# Patient Record
Sex: Male | Born: 1975 | Race: White | Hispanic: No | Marital: Single | Smoking: Current every day smoker
Health system: Southern US, Community
[De-identification: ages and names within clinical notes are randomized; demographics above are authoritative.]

---

## 2017-01-22 ENCOUNTER — Encounter (HOSPITAL_BASED_OUTPATIENT_CLINIC_OR_DEPARTMENT_OTHER): Payer: Self-pay | Admitting: Emergency Medicine

## 2017-01-22 ENCOUNTER — Emergency Department (HOSPITAL_BASED_OUTPATIENT_CLINIC_OR_DEPARTMENT_OTHER): Payer: Managed Care, Other (non HMO)

## 2017-01-22 ENCOUNTER — Other Ambulatory Visit: Payer: Self-pay

## 2017-01-22 DIAGNOSIS — R0981 Nasal congestion: Secondary | ICD-10-CM | POA: Diagnosis not present

## 2017-01-22 DIAGNOSIS — F17210 Nicotine dependence, cigarettes, uncomplicated: Secondary | ICD-10-CM | POA: Diagnosis not present

## 2017-01-22 DIAGNOSIS — M545 Low back pain: Secondary | ICD-10-CM | POA: Insufficient documentation

## 2017-01-22 DIAGNOSIS — J40 Bronchitis, not specified as acute or chronic: Secondary | ICD-10-CM | POA: Diagnosis not present

## 2017-01-22 DIAGNOSIS — R05 Cough: Secondary | ICD-10-CM | POA: Diagnosis present

## 2017-01-22 DIAGNOSIS — R000 Tachycardia, unspecified: Secondary | ICD-10-CM | POA: Diagnosis not present

## 2017-01-22 NOTE — ED Triage Notes (Signed)
PT presents with c/o back pain and cough. PT reports cough for 3 days and back pain that started last night.

## 2017-01-23 ENCOUNTER — Emergency Department (HOSPITAL_BASED_OUTPATIENT_CLINIC_OR_DEPARTMENT_OTHER)
Admission: EM | Admit: 2017-01-23 | Discharge: 2017-01-23 | Disposition: A | Discharge status: Home / Self Care | Payer: Managed Care, Other (non HMO) | Attending: Emergency Medicine | Admitting: Emergency Medicine

## 2017-01-23 DIAGNOSIS — J40 Bronchitis, not specified as acute or chronic: Secondary | ICD-10-CM

## 2017-01-23 MED ORDER — AZITHROMYCIN 250 MG PO TABS: 250.0000 mg | ORAL_TABLET | Freq: Every day | ORAL | 0 refills | Status: AC

## 2017-01-23 NOTE — ED Notes (Addendum)
EDP into room, prior to RN assessment, see MD notes, orders received to d/c.  Alert, NAD, calm, interactive, resps e/u, speaking in clear complete sentences, no dyspnea noted, skin W&D, VSS, HR elevated, recent cigarette and drinking caffeine, low grade fever (99.5), here for cough and back pain (denies: sob, nausea, dizziness or visual changes).

## 2017-01-23 NOTE — ED Provider Notes (Signed)
[facility] EMERGENCY DEPARTMENT Provider Note   CSN: [id number] Arrival date & time: 01/22/17  2256     History   Chief Complaint Chief Complaint  Patient presents with  . Cough  . Back Pain    HPI Jonathon Parks is a 41 y.o. male.  Patient is a 41 year old male with no significant past medical history except for being an every day smoker presenting with 3 days of cough, congestion, lower back pain.  The cough has been in general minimally productive but he is also had some mild fever and chills.  He denies sore throat.  He has had multiple sick contacts from his nieces and nephews.  Coughing makes the pain in the lower back and abdomen worse.  He describes it as a sore sensation and it does not radiate.  He denies any nausea or vomiting.  He has not had consistent shortness of breath.  He states sometimes with coughing he feels slightly winded but not continuously.   The history is provided by the patient.  Cough   Back Pain      History reviewed. No pertinent past medical history.  There are no active problems to display for this patient.   History reviewed. No pertinent surgical history.     Home Medications    Prior to Admission medications   Medication Sig Start Date End Date Taking? Authorizing Provider  azithromycin (ZITHROMAX) 250 MG tablet Take 1 tablet (250 mg total) by mouth daily. Take first 2 tablets together, then 1 every day until finished. 01/23/17   Gwyneth SproutPlunkett, Zykeria Laguardia, MD    Family History No family history on file.  Social History Social History   Tobacco Use  . Smoking status: Current Every Day Smoker  . Smokeless tobacco: Never Used  Substance Use Topics  . Alcohol use: No    Frequency: Never  . Drug use: No     Allergies   Decongest-aid [pseudoephedrine]   Review of Systems Review of Systems  Respiratory: Positive for cough.   Musculoskeletal: Positive for back pain.  All other systems reviewed and are  negative.    Physical Exam Updated Vital Signs BP 120/84 (BP Location: Left Arm)   Pulse (!) 116   Temp 99.5 F (37.5 C) (Oral)   Resp 18   Ht 5\' 6"  (1.676 m)   Wt 55.1 kg (121 lb 6 oz)   SpO2 97%   BMI 19.59 kg/m   Physical Exam  Constitutional: He is oriented to person, place, and time. He appears well-developed and well-nourished. No distress.  HENT:  Head: Normocephalic and atraumatic.  Mouth/Throat: Oropharynx is clear and moist.  Eyes: Conjunctivae and EOM are normal. Pupils are equal, round, and reactive to light.  Neck: Normal range of motion. Neck supple.  Cardiovascular: Regular rhythm and intact distal pulses. Tachycardia present.  No murmur heard. Pulmonary/Chest: Effort normal and breath sounds normal. No respiratory distress. He has no wheezes. He has no rales.  Abdominal: Soft. He exhibits no distension. There is no tenderness. There is no rebound and no guarding.  Musculoskeletal: Normal range of motion. He exhibits tenderness. He exhibits no edema.       Lumbar back: He exhibits pain and spasm. He exhibits no bony tenderness.  Neurological: He is alert and oriented to person, place, and time.  Skin: Skin is warm and dry. Capillary refill takes less than 2 seconds. No rash noted. No erythema.  Psychiatric: He has a normal mood and affect. His behavior is  normal.  Nursing note and vitals reviewed.    ED Treatments / Results  Labs (all labs ordered are listed, but only abnormal results are displayed) Labs Reviewed - No data to display  EKG  EKG Interpretation None       Radiology Dg Chest 2 View  Result Date: 01/22/2017 CLINICAL DATA:  Cough and congestion EXAM: CHEST  2 VIEW COMPARISON:  Report 07/21/2002 FINDINGS: Hyperinflation with mild bronchitic changes. No focal consolidation or effusion. Normal heart size. No pneumothorax. Old left sixth rib fracture IMPRESSION: Hyperinflation with bronchitic changes. No focal pulmonary infiltrate.  Electronically Signed   By: Jasmine PangKim  Fujinaga M.D.   On: 01/22/2017 23:56    Procedures Procedures (including critical care time)  Medications Ordered in ED Medications - No data to display   Initial Impression / Assessment and Plan / ED Course  I have reviewed the triage vital signs and the nursing notes.  Pertinent labs & imaging results that were available during my care of the patient were reviewed by me and considered in my medical decision making (see chart for details).     Pt with symptoms consistent with viral URI/bronchitis.  Well appearing here and in NAD.  No signs of breathing difficulty or wheezing.  Pt is mildly tachy but is running an low grade fever and has had multiple sick contacts.  No signs of pharyngitis, otitis or abnormal abdominal findings.   CXR showing bronchitis.  Since pt is a smoker and low grade fever and cough will cover for atypicals with azithro.  Due to tachycardia and hx of racing heart with decongestants will avoid albuterol at this time.  Low suspicion that pt sx are related to PE and no evidence of PTX.  Final Clinical Impressions(s) / ED Diagnoses   Final diagnoses:  Bronchitis    ED Discharge Orders        Ordered    azithromycin (ZITHROMAX) 250 MG tablet  Daily     01/23/17 Lulu Riding0025       Advith Martine, MD 01/23/17 [phone]

## 2018-11-24 IMAGING — CR DG CHEST 2V
2 series · 2 of 2 positions shown · non-contrast
Comparison: Report 07/21/2002

CLINICAL DATA: Cough and congestion

EXAM:
CHEST  2 VIEW

[w chest pa]
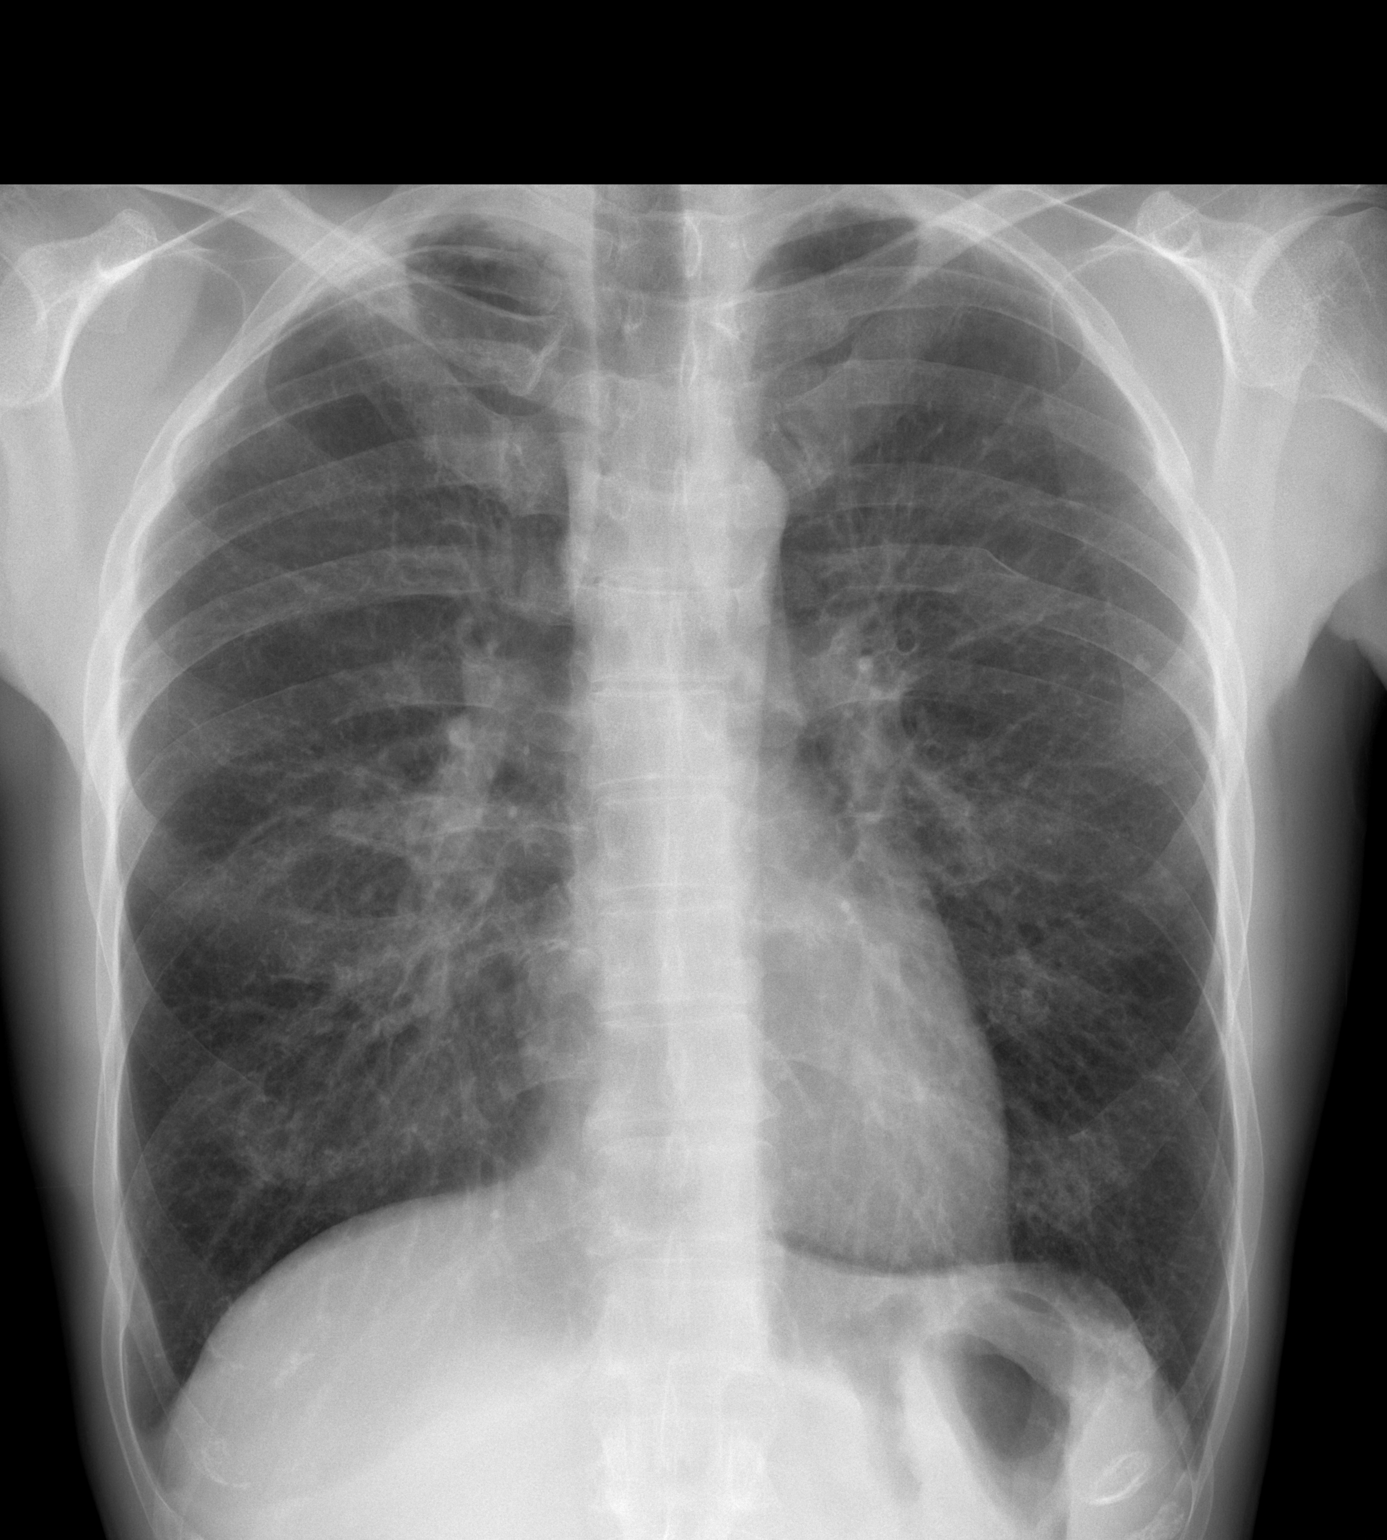

[w chest lat]
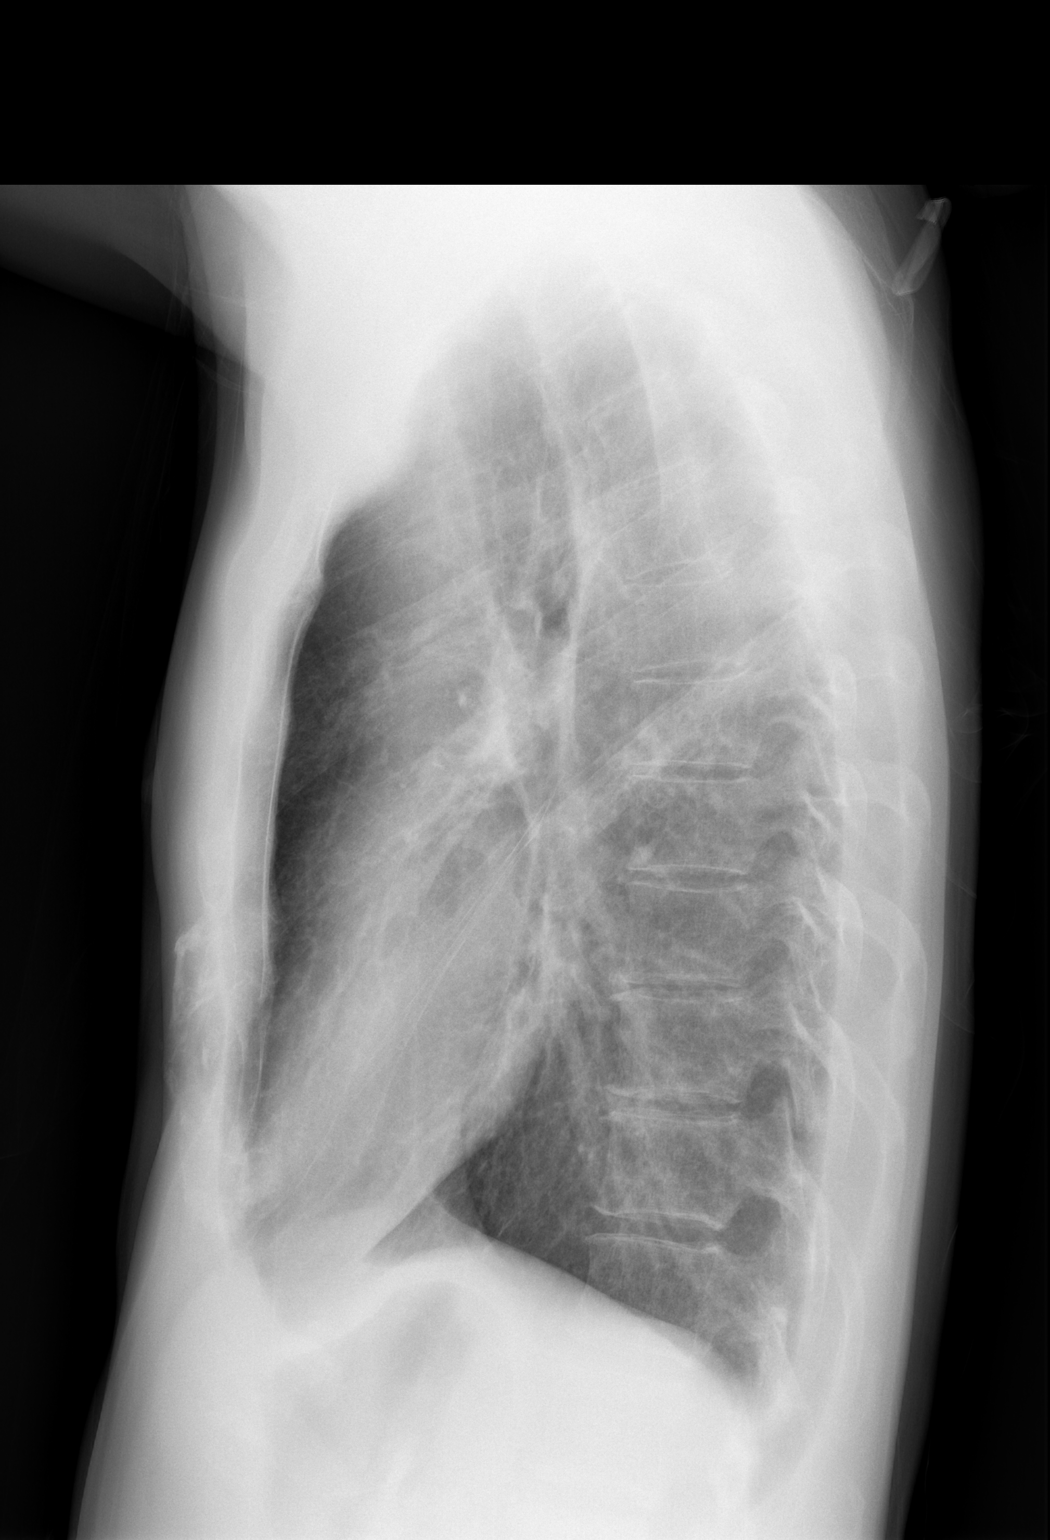

[2 of 2 positions shown; findings below may reference images not displayed]

FINDINGS: Hyperinflation with mild bronchitic changes. No focal consolidation
or effusion. Normal heart size. No pneumothorax. Old left sixth rib
fracture
IMPRESSION: Hyperinflation with bronchitic changes. No focal pulmonary
infiltrate.

## 2023-01-20 ENCOUNTER — Other Ambulatory Visit: Payer: Self-pay

## 2023-01-20 ENCOUNTER — Encounter (HOSPITAL_BASED_OUTPATIENT_CLINIC_OR_DEPARTMENT_OTHER): Payer: Self-pay

## 2023-01-20 ENCOUNTER — Emergency Department (HOSPITAL_BASED_OUTPATIENT_CLINIC_OR_DEPARTMENT_OTHER)
Admission: EM | Admit: 2023-01-20 | Discharge: 2023-01-20 | Disposition: A | Discharge status: Home / Self Care | Payer: BLUE CROSS/BLUE SHIELD | Attending: Emergency Medicine | Admitting: Emergency Medicine

## 2023-01-20 ENCOUNTER — Emergency Department (HOSPITAL_BASED_OUTPATIENT_CLINIC_OR_DEPARTMENT_OTHER): Payer: BLUE CROSS/BLUE SHIELD

## 2023-01-20 DIAGNOSIS — S52592A Other fractures of lower end of left radius, initial encounter for closed fracture: Secondary | ICD-10-CM | POA: Diagnosis not present

## 2023-01-20 DIAGNOSIS — W01198A Fall on same level from slipping, tripping and stumbling with subsequent striking against other object, initial encounter: Secondary | ICD-10-CM | POA: Insufficient documentation

## 2023-01-20 DIAGNOSIS — S6992XA Unspecified injury of left wrist, hand and finger(s), initial encounter: Secondary | ICD-10-CM | POA: Diagnosis present

## 2023-01-20 MED ORDER — HYDROCODONE-ACETAMINOPHEN 5-325 MG PO TABS
1.0000 | ORAL_TABLET | Freq: Once | ORAL | Status: AC
  Administered 2023-01-20: 1 via ORAL
  Filled 2023-01-20: qty 1

## 2023-01-20 MED ORDER — HYDROCODONE-ACETAMINOPHEN 5-325 MG PO TABS: 1.0000 | ORAL_TABLET | Freq: Four times a day (QID) | ORAL | 0 refills | Status: AC | PRN

## 2023-01-20 NOTE — ED Provider Notes (Signed)
[place] EMERGENCY DEPARTMENT AT [facility] Provider Note   CSN: [id number] Arrival date & time: 01/20/23  1717     History  Chief Complaint  Patient presents with   Wrist Pain    Jonathon Parks is a 47 y.o. male with no pertinent past medical history presented with left wrist pain after having a central air conditioning unit fall on it.  Patient was going up the steps with the dowel with the unit in place when he fell backwards and landed on his left wrist.  Patient has hitting his head and denies any head or neck injury or vision changes or LOC or blood thinners or bleeding disorders.  Patient has a hurts move his left wrist but they can still move his left hand and denies any skin color changes or paresthesias.  Patient does note that his left wrist does appear swollen.  Home Medications Prior to Admission medications   Medication Sig Start Date End Date Taking? Authorizing Provider  HYDROcodone-acetaminophen (NORCO) 5-325 MG tablet Take 1 tablet by mouth every 6 (six) hours as needed for up to 5 days for moderate pain (pain score 4-6). 01/20/23 01/25/23 Yes Sharra Cayabyab, Beverly Gust, PA-C  azithromycin (ZITHROMAX) 250 MG tablet Take 1 tablet (250 mg total) by mouth daily. Take first 2 tablets together, then 1 every day until finished. 01/23/17   Gwyneth Sprout, MD      Allergies    Decongest-aid [pseudoephedrine]    Review of Systems   Review of Systems  Physical Exam Updated Vital Signs BP (!) 137/101 (BP Location: Left Arm)   Pulse 99   Temp 98 F (36.7 C) (Oral)   Resp 15   Ht 5\' 6"  (1.676 m)   Wt 59 kg   SpO2 97%   BMI 20.98 kg/m  Physical Exam Vitals reviewed.  Constitutional:      General: He is not in acute distress. Cardiovascular:     Rate and Rhythm: Normal rate.     Pulses: Normal pulses.  Musculoskeletal:     Comments: Left wrist: Edematous and tender to palpation however no crepitus, unable to range due to pain, 5 out of 5 grip strength/elbow  flexion/extension, no signs of open wounds Pain not out of proportion Soft compartments  Skin:    General: Skin is warm and dry.     Capillary Refill: Capillary refill takes less than 2 seconds.     Comments: No overlying skin color changes  Neurological:     Mental Status: He is alert.     Comments: Sensation intact distally  Psychiatric:        Mood and Affect: Mood normal.     ED Results / Procedures / Treatments   Labs (all labs ordered are listed, but only abnormal results are displayed) Labs Reviewed - No data to display  EKG None  Radiology DG Wrist Complete Left  Result Date: 01/20/2023 CLINICAL DATA:  Fall.  Left wrist pain. EXAM: LEFT WRIST - COMPLETE 3+ VIEW COMPARISON:  None Available. FINDINGS: Minimally displaced fracture is present in the distal radial metaphysis without significant angulation. No definite intra-articular component is present. Carpal bones are intact. IMPRESSION: Minimally displaced fracture of the distal radial metaphysis. Electronically Signed   By: Marin Roberts M.D.   On: 01/20/2023 17:50    Procedures Procedures    Medications Ordered in ED Medications  HYDROcodone-acetaminophen (NORCO/VICODIN) 5-325 MG per tablet 1 tablet (has no administration in time range)    ED Course/ Medical  Decision Making/ A&P                                 Medical Decision Making Amount and/or Complexity of Data Reviewed Radiology: ordered.  Risk Prescription drug management.   Jonathon Parks 47 y.o. presented today for left wrist pain. Working DDx that I considered at this time includes, but not limited to, contusion, strain/sprain, fracture, dislocation, neurovascular compromise, septic joint, ischemic limb, compartment syndrome.  R/o DDx: contusion, strain/sprain, dislocation, neurovascular compromise, septic joint, ischemic limb, compartment syndrome: These are considered less likely due to history of present illness, physical exam,  labs/imaging findings.  Review of prior external notes: 10/26/2022 telephone  Unique Tests and My Interpretation:  Left wrist x-ray: Minimally displaced distal radius fracture  Social Determinants of Health: none  Discussion with Independent Historian: None  Discussion of Management of Tests: None  Risk: Medium: prescription drug management  Risk Stratification Score: None  Plan: On exam patient was in no acute distress with stable vitals.  Patient's physical exam does show swelling in the left wrist but otherwise is neurovascularly intact.  X-ray does confirm fracture and so will place in volar splint and have him follow-up with his hand specialist at [facility].  Patient given 1 dose of pain meds here and will be prescribed a few days worth of pain meds.  Encouraged patient to use Tylenol every 6 hours needed for pain with ice and rest over the next few days.  Patient was offered a work note but declined at this time.  I spoke to patient about return precautions including rigid compartments, pain out of proportion, paresthesias, weakness, etc. and patient verbalizes understanding of these return precautions.  Patient was given return precautions. Patient stable for discharge at this time.  Patient verbalized understanding of plan.  This chart was dictated using voice recognition software.  Despite best efforts to proofread,  errors can occur which can change the documentation meaning.         Final Clinical Impression(s) / ED Diagnoses Final diagnoses:  Other closed fracture of distal end of left radius, initial encounter    Rx / DC Orders ED Discharge Orders          Ordered    HYDROcodone-acetaminophen (NORCO) 5-325 MG tablet  Every 6 hours PRN        01/20/23 1803              Remi Deter 01/20/23 1809    Loetta Rough, MD 01/20/23 [phone]

## 2023-01-20 NOTE — ED Triage Notes (Signed)
Pt reports he was carrying a large air conditioner up the steps with a hand truck when it slipped and fell onto him. He states he maybe fell down 2 steps. This occurred today around 1400.  He reports pain to left wrist. No other injuries.

## 2023-01-20 NOTE — Discharge Instructions (Signed)
Please follow-up with your hand specialist within 1 have attached you for you in regards recent symptoms and ER visit.  Today your x-ray shows you have a fracture of the distal end of your radius or wrist and so we placed you in a splint.  You may take Tylenol every 6 hours needed for pain however if pain not controlled by this you may use the Norco I prescribed for you.  If you begin to have change in sensation/motor skills, skin color changes, rigid compartments, pain not controlled by the Norco please return to ER to be evaluated.
